# Patient Record
Sex: Male | Born: 1985 | Race: Black or African American | Hispanic: No | Marital: Single | Smoking: Never smoker
Health system: Southern US, Community
[De-identification: ages and names within clinical notes are randomized; demographics above are authoritative.]

## PROBLEM LIST (undated history)

## (undated) DIAGNOSIS — M549 Dorsalgia, unspecified: Secondary | ICD-10-CM

## (undated) DIAGNOSIS — M5430 Sciatica, unspecified side: Secondary | ICD-10-CM

## (undated) DIAGNOSIS — G8929 Other chronic pain: Secondary | ICD-10-CM

## (undated) DIAGNOSIS — W3400XA Accidental discharge from unspecified firearms or gun, initial encounter: Secondary | ICD-10-CM

---

## 2006-09-27 ENCOUNTER — Emergency Department (HOSPITAL_COMMUNITY): Admission: EM | Admit: 2006-09-27 | Discharge: 2006-09-27 | Payer: Self-pay | Admitting: Emergency Medicine

## 2007-02-07 ENCOUNTER — Emergency Department (HOSPITAL_COMMUNITY): Admission: EM | Admit: 2007-02-07 | Discharge: 2007-02-07 | Payer: Self-pay | Admitting: Emergency Medicine

## 2009-08-20 ENCOUNTER — Emergency Department (HOSPITAL_COMMUNITY): Admission: EM | Admit: 2009-08-20 | Discharge: 2009-08-20 | Payer: Self-pay | Admitting: Emergency Medicine

## 2010-06-10 ENCOUNTER — Emergency Department (HOSPITAL_COMMUNITY)
Admission: EM | Admit: 2010-06-10 | Discharge: 2010-06-10 | Disposition: A | Discharge status: Home / Self Care | Payer: Self-pay | Attending: Emergency Medicine | Admitting: Emergency Medicine

## 2010-06-10 ENCOUNTER — Emergency Department (HOSPITAL_COMMUNITY): Payer: Self-pay

## 2010-06-10 DIAGNOSIS — Y9361 Activity, american tackle football: Secondary | ICD-10-CM | POA: Insufficient documentation

## 2010-06-10 DIAGNOSIS — M7989 Other specified soft tissue disorders: Secondary | ICD-10-CM | POA: Insufficient documentation

## 2010-06-10 DIAGNOSIS — W1801XA Striking against sports equipment with subsequent fall, initial encounter: Secondary | ICD-10-CM | POA: Insufficient documentation

## 2010-06-10 DIAGNOSIS — M25569 Pain in unspecified knee: Secondary | ICD-10-CM | POA: Insufficient documentation

## 2010-06-16 LAB — DIFFERENTIAL
Eosinophils Absolute: 0.1 10*3/uL (ref 0.0–0.7)
Eosinophils Relative: 3 % (ref 0–5)
Lymphs Abs: 1.9 10*3/uL (ref 0.7–4.0)
Monocytes Absolute: 0.4 10*3/uL (ref 0.1–1.0)
Monocytes Relative: 8 % (ref 3–12)
Neutro Abs: 1.9 10*3/uL (ref 1.7–7.7)
Neutrophils Relative %: 45 % (ref 43–77)

## 2010-06-16 LAB — ETHANOL: Alcohol, Ethyl (B): <5 mg/dL (ref 0–10)

## 2010-06-16 LAB — BASIC METABOLIC PANEL
BUN: 17 mg/dL (ref 6–23)
CO2: 27 mEq/L (ref 19–32)
Chloride: 103 mEq/L (ref 96–112)
GFR calc Af Amer: >60 mL/min (ref >60)
GFR calc non Af Amer: >60 mL/min (ref >60)
Glucose, Bld: 87 mg/dL (ref 70–99)
Potassium: 4.1 mEq/L (ref 3.5–5.1)

## 2010-06-16 LAB — URINE MICROSCOPIC-ADD ON

## 2010-06-16 LAB — URINALYSIS, ROUTINE W REFLEX MICROSCOPIC
Glucose, UA: NEGATIVE mg/dL
Hgb urine dipstick: NEGATIVE
Nitrite: NEGATIVE
pH: 6 (ref 5.0–8.0)

## 2010-06-16 LAB — TRICYCLICS SCREEN, URINE: TCA Scrn: NOT DETECTED

## 2010-06-16 LAB — CBC
MCHC: 34.2 g/dL (ref 30.0–36.0)
Platelets: 260 10*3/uL (ref 150–400)
RDW: 12.7 % (ref 11.5–15.5)

## 2010-06-16 LAB — RAPID URINE DRUG SCREEN, HOSP PERFORMED: Benzodiazepines: NOT DETECTED

## 2011-09-05 ENCOUNTER — Emergency Department (INDEPENDENT_AMBULATORY_CARE_PROVIDER_SITE_OTHER)
Admission: EM | Admit: 2011-09-05 | Discharge: 2011-09-05 | Disposition: A | Discharge status: Home / Self Care | Payer: Self-pay | Source: Home / Self Care

## 2011-09-05 ENCOUNTER — Encounter (HOSPITAL_COMMUNITY): Payer: Self-pay | Admitting: *Deleted

## 2011-09-05 DIAGNOSIS — M549 Dorsalgia, unspecified: Secondary | ICD-10-CM

## 2011-09-05 MED ORDER — PREDNISONE 50 MG PO TABS: 50.0000 mg | ORAL_TABLET | Freq: Every day | ORAL | Status: DC

## 2011-09-05 MED ORDER — IBUPROFEN 800 MG PO TABS: 800.0000 mg | ORAL_TABLET | Freq: Three times a day (TID) | ORAL | Status: AC

## 2011-09-05 MED ORDER — KETOROLAC TROMETHAMINE 60 MG/2ML IM SOLN
60.0000 mg | Freq: Once | INTRAMUSCULAR | Status: AC
  Administered 2011-09-05: 60 mg via INTRAMUSCULAR

## 2011-09-05 MED ORDER — CYCLOBENZAPRINE HCL 10 MG PO TABS: 10.0000 mg | ORAL_TABLET | Freq: Two times a day (BID) | ORAL | Status: AC | PRN

## 2011-09-05 MED ORDER — KETOROLAC TROMETHAMINE 60 MG/2ML IM SOLN
INTRAMUSCULAR | Status: AC
  Filled 2011-09-05: qty 2

## 2011-09-05 NOTE — ED Notes (Signed)
Pt with onset of low back pain x one month - worse with movement - per pt onset after lifting a motorized wheelchair

## 2011-09-05 NOTE — Discharge Instructions (Signed)
Back Exercises   Back exercises help treat and prevent back injuries. The goal of back exercises is to increase the strength of your abdominal and back muscles and the flexibility of your back. These exercises should be started when you no longer have back pain. Back exercises include:   Pelvic Tilt. Lie on your back with your knees bent. Tilt your pelvis until the lower part of your back is against the floor. Hold this position 5 to 10 sec and repeat 5 to 10 times.   Knee to Chest. Pull first 1 knee up against your chest and hold for 20 to 30 seconds, repeat this with the other knee, and then both knees. This may be done with the other leg straight or bent, whichever feels better.   Sit-Ups or Curl-Ups. Bend your knees 90 degrees. Start with tilting your pelvis, and do a partial, slow sit-up, lifting your trunk only 30 to 45 degrees off the floor. Take at least 2 to 3 seconds for each sit-up. Do not do sit-ups with your knees out straight. If partial sit-ups are difficult, simply do the above but with only tightening your abdominal muscles and holding it as directed.   Hip-Lift. Lie on your back with your knees flexed 90 degrees. Push down with your feet and shoulders as you raise your hips a couple inches off the floor; hold for 10 seconds, repeat 5 to 10 times.   Back arches. Lie on your stomach, propping yourself up on bent elbows. Slowly press on your hands, causing an arch in your low back. Repeat 3 to 5 times. Any initial stiffness and discomfort should lessen with repetition over time.   Shoulder-Lifts. Lie face down with arms beside your body. Keep hips and torso pressed to floor as you slowly lift your head and shoulders off the floor.   Do not overdo your exercises, especially in the beginning. Exercises may cause you some mild back discomfort which lasts for a few minutes; however, if the pain is more severe, or lasts for more than 15 minutes, do not continue exercises until you see your caregiver.  Improvement with exercise therapy for back problems is slow.   See your caregivers for assistance with developing a proper back exercise program.   Document Released: 04/23/2004 Document Revised: 03/05/2011 Document Reviewed: 03/16/2005   Exit[employer]® Patient Information ©2012 [employer].     Back Pain, Adult   Low back pain is very common. About 1 in 5 people have back pain. The cause of low back pain is rarely dangerous. The pain often gets better over time. About half of people with a sudden onset of back pain feel better in just 2 weeks. About 8 in 10 people feel better by 6 weeks.   CAUSES   Some common causes of back pain include:   Strain of the muscles or ligaments supporting the spine.   Wear and tear (degeneration) of the spinal discs.   Arthritis.   Direct injury to the back.   DIAGNOSIS   Most of the time, the direct cause of low back pain is not known. However, back pain can be treated effectively even when the exact cause of the pain is unknown. Answering your caregiver's questions about your overall health and symptoms is one of the most accurate ways to make sure the cause of your pain is not dangerous. If your caregiver needs more information, he or she may order lab work or imaging tests (X-rays or MRIs). However, even   if imaging tests show changes in your back, this usually does not require surgery.   HOME CARE INSTRUCTIONS   For many people, back pain returns. Since low back pain is rarely dangerous, it is often a condition that people can learn to manage on their own.   Remain active. It is stressful on the back to sit or stand in one place. Do not sit, drive, or stand in one place for more than 30 minutes at a time. Take short walks on level surfaces as soon as pain allows. Try to increase the length of time you walk each day.   Do not stay in bed. Resting more than 1 or 2 days can delay your recovery.   Do not avoid exercise or work. Your body is made to move. It is not dangerous to be active,  even though your back may hurt. Your back will likely heal faster if you return to being active before your pain is gone.   Pay attention to your body when you bend and lift. Many people have less discomfort when lifting if they bend their knees, keep the load close to their bodies, and avoid twisting. Often, the most comfortable positions are those that put less stress on your recovering back.   Find a comfortable position to sleep. Use a firm mattress and lie on your side with your knees slightly bent. If you lie on your back, put a pillow under your knees.   Only take over-the-counter or prescription medicines as directed by your caregiver. Over-the-counter medicines to reduce pain and inflammation are often the most helpful. Your caregiver may prescribe muscle relaxant drugs. These medicines help dull your pain so you can more quickly return to your normal activities and healthy exercise.   Put ice on the injured area.   Put ice in a plastic bag.   Place a towel between your skin and the bag.   Leave the ice on for 15 to 20 minutes, 3 to 4 times a day for the first 2 to 3 days. After that, ice and heat may be alternated to reduce pain and spasms.   Ask your caregiver about trying back exercises and gentle massage. This may be of some benefit.   Avoid feeling anxious or stressed. Stress increases muscle tension and can worsen back pain. It is important to recognize when you are anxious or stressed and learn ways to manage it. Exercise is a great option.   SEEK MEDICAL CARE IF:   You have pain that is not relieved with rest or medicine.   You have pain that does not improve in 1 week.   You have new symptoms.   You are generally not feeling well.   SEEK IMMEDIATE MEDICAL CARE IF:   You have pain that radiates from your back into your legs.   You develop new bowel or bladder control problems.   You have unusual weakness or numbness in your arms or legs.   You develop nausea or vomiting.   You develop abdominal  pain.   You feel faint.   Document Released: 03/16/2005 Document Revised: 03/05/2011 Document Reviewed: 08/04/2010   ExitCare® Patient Information ©2012 [employer].

## 2011-09-05 NOTE — ED Provider Notes (Signed)
History     CSN: [id number]  Arrival date & time 09/05/11  1255   None     Chief Complaint  Patient presents with  . Back Pain    (Consider location/radiation/quality/duration/timing/severity/associated sxs/prior treatment) Patient is a 26 y.o. male presenting with back pain. The history is provided by the patient.  Back Pain   complains of right low back pain described as intermittent sharp in nature that began 2 months ago. The pain is aggravated with standing and walking, with no radiation. No known injury noted.  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  No istory of back problems. Has not taken medication for pain.  Denies urinary symptoms.  Pain is 9/10. No red flags such as fevers, age >66, h/o trauma with bony tenderness, neurological deficits, h/o CA, unexplained weight loss, pain worse at night, pain at rest,  h/o prolonged steroid use or h/o osteopenia.    History reviewed. No pertinent past medical history.  History reviewed. No pertinent past surgical history.  History reviewed. No pertinent family history.  History  Substance Use Topics  . Smoking status: Never Smoker   . Smokeless tobacco: Not on file  . Alcohol Use: No      Review of Systems  Musculoskeletal: Positive for back pain.  All other systems reviewed and are negative.    Allergies  Review of patient's allergies indicates no known allergies.  Home Medications   Current Outpatient Rx  Name Route Sig Dispense Refill  . CYCLOBENZAPRINE HCL 10 MG PO TABS Oral Take 1 tablet (10 mg total) by mouth 2 (two) times daily as needed for muscle spasms. 20 tablet 0  . IBUPROFEN 800 MG PO TABS Oral Take 1 tablet (800 mg total) by mouth 3 (three) times daily. 21 tablet 0  . PREDNISONE 50 MG PO TABS Oral Take 1 tablet (50 mg total) by mouth daily. For five days. 15 tablet 0    BP 120/71  Pulse 54  Temp(Src) 98 F (36.7 C) (Oral)  Resp 18   SpO2 100%  Physical Exam  Nursing note and vitals reviewed. Constitutional: He is oriented to person, place, and time. Vital signs are normal. He appears well-developed and well-nourished. He is active and cooperative.  HENT:  Head: Normocephalic.  Eyes: Conjunctivae are normal. Pupils are equal, round, and reactive to light. No scleral icterus.  Neck: Trachea normal. Neck supple.  Cardiovascular: Normal rate, regular rhythm and normal heart sounds.   Pulmonary/Chest: Effort normal and breath sounds normal.  Musculoskeletal:       Cervical back: Normal.       Thoracic back: Normal.       Lumbar back: He exhibits tenderness and spasm. He exhibits normal range of motion, no bony tenderness and no swelling.       Decreased flexion approx. 45 degrees, normal (painful) lateral bending, normal (painful) right and left rotation.  Lower paraspinal tenderness upon palpation.  Neurological: He is alert and oriented to person, place, and time. He has normal strength and normal reflexes. No cranial nerve deficit or sensory deficit. He displays a negative Romberg sign. Gait normal. GCS eye subscore is 4. GCS verbal subscore is 5. GCS motor subscore is 6.  Skin: Skin is warm, dry and intact.  Psychiatric: He has a normal mood and affect. His speech is normal and behavior is normal. Judgment and thought content normal. Cognition and memory are normal.  ED Course  Procedures (including critical care time)  Labs Reviewed - No data to display No results found.   1. Back pain       MDM  Typical low back pain.  Rest (no lifting for one week), intermittent application of cold packs (later, may switch to heat, but do not sleep on heating pad), analgesics and muscle relaxants as recommended. Discussed longer term treatment plan of prn NSAID's and discussed a home back care exercise program with flexion exercise routine. Proper lifting with avoidance of heavy lifting discussed. Consider physical therapy  and X-ray studies if not improving. Call or return to clinic prn if these symptoms worsen or fail to improve as anticipated. Imaging not indicated at this time.         Johnsie Kindred, NP 09/05/11 2104

## 2011-09-06 NOTE — ED Provider Notes (Signed)
Medical screening examination/treatment/procedure(s) were performed by non-physician practitioner and as supervising physician I was immediately available for consultation/collaboration.  Wing Gfeller   Tierre Gerard, MD 09/06/11 1038 

## 2012-04-15 ENCOUNTER — Emergency Department (HOSPITAL_COMMUNITY)
Admission: EM | Admit: 2012-04-15 | Discharge: 2012-04-15 | Disposition: A | Discharge status: Home / Self Care | Payer: Self-pay | Attending: Emergency Medicine | Admitting: Emergency Medicine

## 2012-04-15 ENCOUNTER — Encounter (HOSPITAL_COMMUNITY): Payer: Self-pay | Admitting: *Deleted

## 2012-04-15 DIAGNOSIS — M5442 Lumbago with sciatica, left side: Secondary | ICD-10-CM

## 2012-04-15 DIAGNOSIS — M545 Low back pain, unspecified: Secondary | ICD-10-CM | POA: Insufficient documentation

## 2012-04-15 DIAGNOSIS — M79609 Pain in unspecified limb: Secondary | ICD-10-CM | POA: Insufficient documentation

## 2012-04-15 DIAGNOSIS — M5430 Sciatica, unspecified side: Secondary | ICD-10-CM | POA: Insufficient documentation

## 2012-04-15 MED ORDER — HYDROCODONE-ACETAMINOPHEN 5-325 MG PO TABS: 1.0000 | ORAL_TABLET | ORAL | Status: DC | PRN

## 2012-04-15 MED ORDER — METHOCARBAMOL 500 MG PO TABS: 500.0000 mg | ORAL_TABLET | Freq: Two times a day (BID) | ORAL | Status: DC

## 2012-04-15 NOTE — ED Provider Notes (Signed)
History     CSN: [id number]  Arrival date & time 04/15/12  0800   First MD Initiated Contact with Patient 04/15/12 0803      No chief complaint on file.   (Consider location/radiation/quality/duration/timing/severity/associated sxs/prior treatment) HPI  27 year old male presents complaining of left leg pain. Patient reports for the past 2 months he has developed gradual onset of sharp shooting pain which started from his low back and radiates down to the back of his left leg. Pain is moderate in severity, radiating, occurring daily, worsening when he stands still or worsening with certain position. He tried stretching without adequate relief. Due to the duration of his pain he decided to come to the ER today for further evaluation. He denies any worsening of his pain. Patient denies fever, chills, rash, urinary or bowel incontinence, or saddle paresthesia.  No history of IV drug use, no history of abnormal weight loss, no recent trauma. Denies dysuria, hematuria. No significant medical history of back pain. 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  No past medical history on file.  No past surgical history on file.  No family history on file.  History  Substance Use Topics  . Smoking status: Never Smoker   . Smokeless tobacco: Not on file  . Alcohol Use: No      Review of Systems  Constitutional:       A complete 10 system review of systems was obtained and all systems are negative except as noted in the HPI and PMH.    Allergies  Review of patient's allergies indicates no known allergies.  Home Medications  No current outpatient prescriptions on file.  There were no vitals taken for this visit.  Physical Exam  Nursing note and vitals reviewed. Constitutional: He is oriented to person, place, and time. He appears well-developed and well-nourished. No distress.  HENT:  Head: Atraumatic.  Eyes: Conjunctivae normal are normal.  Neck: Neck  supple.  Abdominal: Soft. There is no tenderness. There is no guarding.  Musculoskeletal: He exhibits tenderness (no midline spine tenderness, stepoff or crepitus.  Positive straight leg raise on L leg.  Patella DTR 2+, intact distal pulses, no foot drop.  BLE without palpable cords, erythema, edema, calf pain.). He exhibits no edema.  Neurological: He is alert and oriented to person, place, and time.  Skin: Skin is warm. No rash noted.  Psychiatric: He has a normal mood and affect.    ED Course  Procedures (including critical care time)  Labs Reviewed - No data to display No results found.   No diagnosis found.  1. Sciatica, L  MDM  Pt presents with radicular pain to lower extremity suggestive of L sided sciatica.  No red flags.  Plan to prescribe pain medication, muscle relaxant, exercise instruction and referral to ortho for further care.  Pt made aware not to take narcotic or muscle relaxant while working, operating heavy machinery or driving.  Pt voice understanding and agrees with plan.    Pt able to ambulate without difficulty, afebrile, VSS.   BP 124/79  Pulse 51  Temp 97.2 F (36.2 C) (Oral)  Resp 18  SpO2 100%        Fayrene Helper, PA-C 04/15/12 [phone]

## 2012-04-15 NOTE — ED Notes (Signed)
Pt reports doing heavy lifting at work, has pain to left buttock and down back of left leg, some lower back pain. Ambulatory on arrival.

## 2012-04-22 NOTE — ED Provider Notes (Signed)
Medical screening examination/treatment/procedure(s) were performed by non-physician practitioner and as supervising physician I was immediately available for consultation/collaboration.  Jasmine Awe, MD 04/22/12 2300

## 2012-09-19 ENCOUNTER — Emergency Department (HOSPITAL_COMMUNITY)
Admission: EM | Admit: 2012-09-19 | Discharge: 2012-09-20 | Disposition: A | Discharge status: Home / Self Care | Payer: Self-pay | Attending: Emergency Medicine | Admitting: Emergency Medicine

## 2012-09-19 ENCOUNTER — Encounter (HOSPITAL_COMMUNITY): Payer: Self-pay | Admitting: Emergency Medicine

## 2012-09-19 DIAGNOSIS — M5430 Sciatica, unspecified side: Secondary | ICD-10-CM | POA: Insufficient documentation

## 2012-09-19 DIAGNOSIS — M545 Low back pain: Secondary | ICD-10-CM

## 2012-09-19 DIAGNOSIS — G8929 Other chronic pain: Secondary | ICD-10-CM | POA: Insufficient documentation

## 2012-09-19 HISTORY — DX: Dorsalgia, unspecified: M54.9

## 2012-09-19 HISTORY — DX: Other chronic pain: G89.29

## 2012-09-19 HISTORY — DX: Sciatica, unspecified side: M54.30

## 2012-09-19 NOTE — ED Provider Notes (Signed)
History  This chart was scribed for non-physician practitioner, Dierdre Forth PA-C, working with Brandt Loosen, MD by Ardeen Jourdain, ED Scribe. This patient was seen in room [facility] and the patient's care was started at 2338.  CSN: [id number] Arrival date & time 09/19/12  2127   First MD Initiated Contact with Patient 09/19/12 2338     Chief Complaint  Patient presents with  . Back Pain    Patient is a 27 y.o. male presenting with back pain. The history is provided by the patient. No language interpreter was used.  Back Pain Location:  Lumbar spine Quality:  Aching Radiates to:  L posterior upper leg Pain severity:  Mild Onset quality:  Gradual Timing:  Constant Progression:  Worsening Chronicity:  New Relieved by:  Nothing Worsened by:  Ambulation, touching and bending Ineffective treatments:  None tried Associated symptoms: no abdominal pain, no abdominal swelling, no bladder incontinence, no bowel incontinence, no chest pain, no dysuria, no fever, no headaches, no paresthesias, no tingling, no weakness and no weight loss     HPI Comments: Juan Gardner is a 27 y.o. male with a h/o sciatica and chronic back pain who presents to the Emergency Department complaining of gradual onset, gradually worsening, intermittent left lower back pain that began 8 months ago. He states the symptoms began to steadily worsen and become constant 4 months ago. He states the pain will radiate down his left leg. He states he was evaluated by Urgent Care a few months ago for the symptoms who advised him to come to the ED if the symptoms worsen. He denies any bowel incontinence, bladder incontinence, weakness, numbness and tingling as associated symptoms. He denies taking anything for the pain.  Past Medical History  Diagnosis Date  . Sciatica   . Chronic back pain    History reviewed. No pertinent past surgical history. No family history on file. History  Substance Use Topics  . Smoking  status: Never Smoker   . Smokeless tobacco: Not on file  . Alcohol Use: No    Review of Systems  Constitutional: Negative for fever and weight loss.  Cardiovascular: Negative for chest pain.  Gastrointestinal: Negative for abdominal pain and bowel incontinence.  Genitourinary: Negative for bladder incontinence and dysuria.  Musculoskeletal: Positive for back pain.  Neurological: Negative for tingling, weakness, headaches and paresthesias.  All other systems reviewed and are negative.    Allergies  Review of patient's allergies indicates no known allergies.  Home Medications   Current Outpatient Rx  Name  Route  Sig  Dispense  Refill  . HYDROcodone-acetaminophen (NORCO/VICODIN) 5-325 MG per tablet   Oral   Take 1 tablet by mouth every 6 (six) hours as needed for pain (Take 1 - 2 tablets every 4 - 6 hours.).   15 tablet   0   . methocarbamol (ROBAXIN) 750 MG tablet   Oral   Take 1 tablet (750 mg total) by mouth 4 (four) times daily as needed (Take 1 tablet every 6 hours as needed for muscle spasms.).   20 tablet   0   . naproxen (NAPROSYN) 500 MG tablet   Oral   Take 1 tablet (500 mg total) by mouth 2 (two) times daily as needed.   30 tablet   0   . predniSONE (DELTASONE) 50 MG tablet      Take 1 a day for 7 days.   7 tablet   0     Triage Vitals: BP 130/76  Pulse  52  Temp(Src) 98.5 F (36.9 C) (Oral)  Resp 16  SpO2 100%  Physical Exam  Nursing note and vitals reviewed. Constitutional: He appears well-developed and well-nourished. No distress.  HENT:  Head: Normocephalic and atraumatic.  Mouth/Throat: Oropharynx is clear and moist. No oropharyngeal exudate.  Eyes: Conjunctivae are normal.  Neck: Normal range of motion. Neck supple.  Full ROM without pain  Cardiovascular: Normal rate, regular rhythm, normal heart sounds and intact distal pulses.  Exam reveals no gallop and no friction rub.   No murmur heard. Pulmonary/Chest: Effort normal and breath  sounds normal. No respiratory distress. He has no wheezes. He has no rales. He exhibits no tenderness.  Abdominal: Soft. He exhibits no distension. There is no tenderness.  Musculoskeletal:  Full range of motion of the T-spine. Mildly deceased ROM of L-spine.  No tenderness to palpation of the spinous processes of the T-spine or L-spine. Mild tenderness to palpation of the left paraspinous muscles of the L-spine Reproducible radicular pain with palpation of left buttock   Lymphadenopathy:    He has no cervical adenopathy.  Neurological: He is alert. He has normal reflexes.  Speech is clear and goal oriented, follows commands Normal strength in upper and lower extremities bilaterally including dorsiflexion and plantar flexion, strong and equal grip strength Sensation normal to light and sharp touch Moves extremities without ataxia, coordination intact Normal gait Normal balance   Skin: Skin is warm and dry. No rash noted. He is not diaphoretic. No erythema.    ED Course  Procedures (including critical care time)  DIAGNOSTIC STUDIES: Oxygen Saturation is 100% on room air, normal by my interpretation.    COORDINATION OF CARE:  11:58 PM-Discussed treatment plan which includes pain medication and follow up with an orthopedist with pt at bedside and pt agreed to plan.   Labs Reviewed - No data to display No results found. 1. Low back pain with sciatica, left   2. Low back pain     MDM  Juan Gardner presents with hx and PE consistent with low back pain with radiculopathy and sciatica.   No neurological deficits and normal neuro exam.  Patient can walk but states is painful.  No loss of bowel or bladder control.  No concern for cauda equina.  No fever, night sweats, weight loss, h/o cancer, IVDU.  RICE protocol and pain medicine indicated and discussed with patient. I have also discussed reasons to return immediately to the ER.  Patient expresses understanding and agrees with plan.  I  personally performed the services described in this documentation, which was scribed in my presence. The recorded information has been reviewed and is accurate.    Dahlia Client Ruberta Holck, PA-C 09/20/12 0007

## 2012-09-19 NOTE — ED Notes (Signed)
PT. REPORTS CHRONIC LEFT LOWER BACK PAIN RADIATING TO LEFT LEG WORSE PAST SEVERAL DAYS , PT. STATED HISTORY OF SCIATICA.

## 2012-09-20 MED ORDER — NAPROXEN 500 MG PO TABS: 500.0000 mg | ORAL_TABLET | Freq: Two times a day (BID) | ORAL | Status: DC | PRN

## 2012-09-20 MED ORDER — HYDROCODONE-ACETAMINOPHEN 5-325 MG PO TABS: 1.0000 | ORAL_TABLET | Freq: Four times a day (QID) | ORAL | Status: DC | PRN

## 2012-09-20 MED ORDER — METHOCARBAMOL 750 MG PO TABS: 750.0000 mg | ORAL_TABLET | Freq: Four times a day (QID) | ORAL | Status: DC | PRN

## 2012-09-20 MED ORDER — PREDNISONE 50 MG PO TABS: ORAL_TABLET | ORAL | Status: DC

## 2012-09-20 MED ORDER — METHOCARBAMOL 500 MG PO TABS
500.0000 mg | ORAL_TABLET | Freq: Once | ORAL | Status: AC
  Administered 2012-09-20: 500 mg via ORAL
  Filled 2012-09-20: qty 1

## 2012-09-20 MED ORDER — OXYCODONE-ACETAMINOPHEN 5-325 MG PO TABS
2.0000 | ORAL_TABLET | Freq: Once | ORAL | Status: AC
  Administered 2012-09-20: 2 via ORAL
  Filled 2012-09-20: qty 2

## 2012-09-20 NOTE — ED Provider Notes (Signed)
Medical screening examination/treatment/procedure(s) were performed by non-physician practitioner and as supervising physician I was immediately available for consultation/collaboration.   Darilyn Storbeck, MD 09/20/12 0608 

## 2013-08-07 ENCOUNTER — Emergency Department (HOSPITAL_COMMUNITY)
Admission: EM | Admit: 2013-08-07 | Discharge: 2013-08-07 | Disposition: A | Discharge status: Home / Self Care | Payer: Self-pay | Attending: Emergency Medicine | Admitting: Emergency Medicine

## 2013-08-07 ENCOUNTER — Encounter (HOSPITAL_COMMUNITY): Payer: Self-pay | Admitting: Emergency Medicine

## 2013-08-07 DIAGNOSIS — M545 Low back pain, unspecified: Secondary | ICD-10-CM | POA: Insufficient documentation

## 2013-08-07 DIAGNOSIS — M5380 Other specified dorsopathies, site unspecified: Secondary | ICD-10-CM | POA: Insufficient documentation

## 2013-08-07 DIAGNOSIS — M533 Sacrococcygeal disorders, not elsewhere classified: Secondary | ICD-10-CM | POA: Insufficient documentation

## 2013-08-07 DIAGNOSIS — G8929 Other chronic pain: Secondary | ICD-10-CM | POA: Insufficient documentation

## 2013-08-07 MED ORDER — IBUPROFEN 400 MG PO TABS
800.0000 mg | ORAL_TABLET | Freq: Once | ORAL | Status: AC
  Administered 2013-08-07: 800 mg via ORAL
  Filled 2013-08-07: qty 2

## 2013-08-07 MED ORDER — METHOCARBAMOL 500 MG PO TABS
500.0000 mg | ORAL_TABLET | Freq: Once | ORAL | Status: AC
  Administered 2013-08-07: 500 mg via ORAL
  Filled 2013-08-07: qty 1

## 2013-08-07 MED ORDER — METHOCARBAMOL 500 MG PO TABS: 500.0000 mg | ORAL_TABLET | Freq: Two times a day (BID) | ORAL | Status: AC

## 2013-08-07 MED ORDER — LIDOCAINE 5 % EX PTCH: 1.0000 | MEDICATED_PATCH | CUTANEOUS | Status: AC

## 2013-08-07 MED ORDER — IBUPROFEN 800 MG PO TABS: 800.0000 mg | ORAL_TABLET | Freq: Three times a day (TID) | ORAL | Status: AC

## 2013-08-07 NOTE — ED Notes (Signed)
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   Patient states that "I think I pulled a muscle".  Patient states he didn't take anything at home for the pain.   He just wanted "to know if something is wrong".

## 2013-08-07 NOTE — ED Provider Notes (Signed)
CSN: [id number]     Arrival date & time 08/07/13  0807 History   First MD Initiated Contact with Patient 08/07/13 0813     Chief Complaint  Patient presents with  . Back Pain     (Consider location/radiation/quality/duration/timing/severity/associated sxs/prior Treatment) HPI Comments: Patient is a 28 year old male past medical history significant for chronic back pain with sciatica presenting to the emergency department for diffuse low back tightness without radiation. He states he was lifting a can on Saturday while at work he felt he pulled a muscle. States his pain has been getting gradually worse. Alleviating factors: icy hot and massages. Aggravating factors: sitting still for prolonged period of time. No medications PTA. Denies any fevers, chills, nausea, vomiting, bladder or bowel incontinence, night sweats, history of IV drug use or cancer.   Patient is a 28 y.o. male presenting with back pain.  Back Pain Associated symptoms: no fever     Past Medical History  Diagnosis Date  . Sciatica   . Chronic back pain    History reviewed. No pertinent past surgical history. No family history on file. History  Substance Use Topics  . Smoking status: Never Smoker   . Smokeless tobacco: Not on file  . Alcohol Use: No    Review of Systems  Constitutional: Negative for fever and chills.  Musculoskeletal: Positive for back pain.  Neurological: Negative for syncope.  All other systems reviewed and are negative.     Allergies  Review of patient's allergies indicates no known allergies.  Home Medications   Prior to Admission medications   Not on File   BP 114/63  Pulse 65  Temp(Src) 98 F (36.7 C) (Oral)  Resp 18  Ht 6' (1.829 m)  Wt 188 lb (85.276 kg)  BMI 25.49 kg/m2  SpO2 100% Physical Exam  Nursing note and vitals reviewed. Constitutional: He is oriented to person, place, and time. He appears well-developed and well-nourished. No distress.  HENT:  Head:  Normocephalic and atraumatic.  Right Ear: External ear normal.  Left Ear: External ear normal.  Nose: Nose normal.  Mouth/Throat: Oropharynx is clear and moist. No oropharyngeal exudate.  Eyes: Conjunctivae and EOM are normal. Pupils are equal, round, and reactive to light.  Neck: Normal range of motion. Neck supple.  Cardiovascular: Normal rate, regular rhythm, normal heart sounds and intact distal pulses.   Pulmonary/Chest: Effort normal and breath sounds normal. No respiratory distress.  Abdominal: Soft. There is no tenderness.  Musculoskeletal:       Cervical back: Normal. He exhibits normal range of motion, no tenderness, no bony tenderness and no deformity.       Thoracic back: Normal.       Lumbar back: He exhibits tenderness and spasm. He exhibits normal range of motion, no bony tenderness, no swelling, no deformity and no pain.  Neurological: He is alert and oriented to person, place, and time. He has normal strength. No cranial nerve deficit. Gait normal. GCS eye subscore is 4. GCS verbal subscore is 5. GCS motor subscore is 6.  Sensation grossly intact.  No pronator drift.  Bilateral heel-knee-shin intact.  Skin: Skin is warm and dry. He is not diaphoretic.    ED Course  Procedures (including critical care time) Medications  methocarbamol (ROBAXIN) tablet 500 mg (500 mg Oral Given 08/07/13 0849)  ibuprofen (ADVIL,MOTRIN) tablet 800 mg (800 mg Oral Given 08/07/13 0849)    Labs Review Labs Reviewed - No data to display  Imaging Review No results found.  EKG Interpretation None      MDM   Final diagnoses:  Back pain, lumbosacral    Filed Vitals:   08/07/13 0822  BP: 114/63  Pulse: 65  Temp: 98 F (36.7 C)  Resp: 18   Afebrile, NAD, non-toxic appearing, AAOx4.  Patient with back pain.  No neurological deficits and normal neuro exam.  Patient can walk but states is painful.  No loss of bowel or bladder control.  No concern for cauda equina.  No fever, night  sweats, weight loss, h/o cancer, IVDU.  RICE protocol and pain medicine indicated and discussed with patient. Return precautions discussed. Patient agreeable to plan. Patient stable at time of discharge.     Jeannetta EllisJennifer L Lillyauna Jenkinson, PA-C 08/07/13 [phone]

## 2013-08-07 NOTE — Discharge Instructions (Signed)
Please follow up with your primary care physician in 1-2 days. If you do not have one please [facility] listed above. Please take pain medication and/or muscle relaxants as prescribed and as needed for pain. Please do not drive on narcotic pain medication or on muscle relaxants. Please alternate between Motrin and Tylenol every three hours for fevers and pain. Please use lidoderm patches as prescribed. Please read all discharge instructions and return precautions.   Back Pain, Adult Low back pain is very common. About 1 in 5 people have back pain.The cause of low back pain is rarely dangerous. The pain often gets better over time.About half of people with a sudden onset of back pain feel better in just 2 weeks. About 8 in 10 people feel better by 6 weeks.  CAUSES Some common causes of back pain include:  Strain of the muscles or ligaments supporting the spine.  Wear and tear (degeneration) of the spinal discs.  Arthritis.  Direct injury to the back. DIAGNOSIS Most of the time, the direct cause of low back pain is not known.However, back pain can be treated effectively even when the exact cause of the pain is unknown.Answering your caregiver's questions about your overall health and symptoms is one of the most accurate ways to make sure the cause of your pain is not dangerous. If your caregiver needs more information, he or she may order lab work or imaging tests (X-rays or MRIs).However, even if imaging tests show changes in your back, this usually does not require surgery. HOME CARE INSTRUCTIONS For many people, back pain returns.Since low back pain is rarely dangerous, it is often a condition that people can learn to [facility] their own.   Remain active. It is stressful on the back to sit or stand in one place. Do not sit, drive, or stand in one place for more than 30 minutes at a time. Take short walks on level surfaces as soon as pain allows.Try to  increase the length of time you walk each day.  Do not stay in bed.Resting more than 1 or 2 days can delay your recovery.  Do not avoid exercise or work.Your body is made to move.It is not dangerous to be active, even though your back may hurt.Your back will likely heal faster if you return to being active before your pain is gone.  Pay attention to your body when you bend and lift. Many people have less discomfortwhen lifting if they bend their knees, keep the load close to their bodies,and avoid twisting. Often, the most comfortable positions are those that put less stress on your recovering back.  Find a comfortable position to sleep. Use a firm mattress and lie on your side with your knees slightly bent. If you lie on your back, put a pillow under your knees.  Only take over-the-counter or prescription medicines as directed by your caregiver. Over-the-counter medicines to reduce pain and inflammation are often the most helpful.Your caregiver may prescribe muscle relaxant drugs.These medicines help dull your pain so you can more quickly return to your normal activities and healthy exercise.  Put ice on the injured area.  Put ice in a plastic bag.  Place a towel between your skin and the bag.  Leave the ice on for 15-20 minutes, 03-04 times a day for the first 2 to 3 days. After that, ice and heat may be alternated to reduce pain and spasms.  Ask your caregiver about trying back exercises  and gentle massage. This may be of some benefit.  Avoid feeling anxious or stressed.Stress increases muscle tension and can worsen back pain.It is important to recognize when you are anxious or stressed and learn ways to manage it.Exercise is a great option. SEEK MEDICAL CARE IF:  You have pain that is not relieved with rest or medicine.  You have pain that does not improve in 1 week.  You have new symptoms.  You are generally not feeling well. SEEK IMMEDIATE MEDICAL CARE IF:   You  have pain that radiates from your back into your legs.  You develop new bowel or bladder control problems.  You have unusual weakness or numbness in your arms or legs.  You develop nausea or vomiting.  You develop abdominal pain.  You feel faint. Document Released: 03/16/2005 Document Revised: 09/15/2011 Document Reviewed: 08/04/2010 [facility] Patient Information 2014 [employer] [place].

## 2013-08-07 NOTE — ED Provider Notes (Signed)
Medical screening examination/treatment/procedure(s) were performed by non-physician practitioner and as supervising physician I was immediately available for consultation/collaboration.   EKG Interpretation None        Luella Gardenhire, MD 08/07/13 1612 

## 2014-03-26 ENCOUNTER — Emergency Department (HOSPITAL_COMMUNITY)
Admission: EM | Admit: 2014-03-26 | Discharge: 2014-03-26 | Disposition: A | Discharge status: Home / Self Care | Payer: Self-pay | Attending: Emergency Medicine | Admitting: Emergency Medicine

## 2014-03-26 ENCOUNTER — Encounter (HOSPITAL_COMMUNITY): Payer: Self-pay | Admitting: Family Medicine

## 2014-03-26 DIAGNOSIS — G8929 Other chronic pain: Secondary | ICD-10-CM | POA: Insufficient documentation

## 2014-03-26 DIAGNOSIS — K0889 Other specified disorders of teeth and supporting structures: Secondary | ICD-10-CM

## 2014-03-26 DIAGNOSIS — K047 Periapical abscess without sinus: Secondary | ICD-10-CM | POA: Insufficient documentation

## 2014-03-26 DIAGNOSIS — K029 Dental caries, unspecified: Secondary | ICD-10-CM | POA: Insufficient documentation

## 2014-03-26 DIAGNOSIS — K088 Other specified disorders of teeth and supporting structures: Secondary | ICD-10-CM | POA: Insufficient documentation

## 2014-03-26 DIAGNOSIS — Z791 Long term (current) use of non-steroidal anti-inflammatories (NSAID): Secondary | ICD-10-CM | POA: Insufficient documentation

## 2014-03-26 MED ORDER — PENICILLIN V POTASSIUM 500 MG PO TABS: 500.0000 mg | ORAL_TABLET | Freq: Four times a day (QID) | ORAL | Status: AC

## 2014-03-26 MED ORDER — PENICILLIN V POTASSIUM 250 MG PO TABS
500.0000 mg | ORAL_TABLET | Freq: Once | ORAL | Status: AC
  Administered 2014-03-26: 500 mg via ORAL
  Filled 2014-03-26: qty 2

## 2014-03-26 MED ORDER — HYDROCODONE-ACETAMINOPHEN 5-325 MG PO TABS
1.0000 | ORAL_TABLET | Freq: Once | ORAL | Status: AC
Start: 2014-03-26 — End: 2014-03-26
  Administered 2014-03-26: 1 via ORAL
  Filled 2014-03-26: qty 1

## 2014-03-26 MED ORDER — HYDROCODONE-ACETAMINOPHEN 5-325 MG PO TABS: 1.0000 | ORAL_TABLET | ORAL | Status: AC | PRN

## 2014-03-26 NOTE — ED Provider Notes (Signed)
CSN: [id number]     Arrival date & time 03/26/14  1530 [id number]   First MD Initiated Contact with Patient 03/26/14 1814     Chief Complaint  Patient presents with  . Dental Pain     (Consider location/radiation/quality/duration/timing/severity/associated sxs/prior Treatment) The history is provided by the patient and medical records.    This is a 28 year old male with past medical history significant for sciatica, presenting to the ED for left lower dental pain was present upon waking this morning. He states he feels swelling in the back of his mouth. Pain worse with chewing on affected side. No difficulty swallowing. No fever or chills. No facial swelling, numbness, or paresthesias. No intervention tried prior to arrival.  Patient is not currently established with a dentist.  Past Medical History  Diagnosis Date  . Sciatica   . Chronic back pain    History reviewed. No pertinent past surgical history. History reviewed. No pertinent family history. History  Substance Use Topics  . Smoking status: Never Smoker   . Smokeless tobacco: Not on file  . Alcohol Use: No    Review of Systems  HENT: Positive for dental problem.   All other systems reviewed and are negative.     Allergies  Review of patient's allergies indicates no known allergies.  Home Medications   Prior to Admission medications   Medication Sig Start Date End Date Taking? Authorizing Provider  ibuprofen (ADVIL,MOTRIN) 800 MG tablet Take 1 tablet (800 mg total) by mouth 3 (three) times daily. 08/07/13  Yes Jennifer L Piepenbrink, PA-C  HYDROcodone-acetaminophen (NORCO/VICODIN) 5-325 MG per tablet Take 1 tablet by mouth every 4 (four) hours as needed. 03/26/14   Garlon HatchetLisa M Sanders, PA-C  lidocaine (LIDODERM) 5 % Place 1 patch onto the skin daily. Remove & Discard patch within 12 hours or as directed by MD Patient not taking: Reported on 03/26/2014 08/07/13   Victorino DikeJennifer L Piepenbrink, PA-C  methocarbamol (ROBAXIN) 500 MG  tablet Take 1 tablet (500 mg total) by mouth 2 (two) times daily. Patient not taking: Reported on 03/26/2014 08/07/13   Lise AuerJennifer L Piepenbrink, PA-C  penicillin v potassium (VEETID) 500 MG tablet Take 1 tablet (500 mg total) by mouth 4 (four) times daily. 03/26/14 04/02/14  Garlon HatchetLisa M Sanders, PA-C   BP 140/87 mmHg  Pulse 53  Temp(Src) 98.3 F (36.8 C) (Oral)  Resp 20  Ht 6' (1.829 m)  Wt 190 lb (86.183 kg)  BMI 25.76 kg/m2  SpO2 100% Physical Exam  Constitutional: He is oriented to person, place, and time. He appears well-developed and well-nourished. No distress.  HENT:  Head: Normocephalic and atraumatic.  Mouth/Throat: Uvula is midline, oropharynx is clear and moist and mucous membranes are normal. Abnormal dentition. Dental abscesses and dental caries present. No oropharyngeal exudate, posterior oropharyngeal edema, posterior oropharyngeal erythema or tonsillar abscesses.  Teeth largely in poor dentition, right lower molar with large cavity, surrounding gingiva significantly swollen, handling secretions appropriately, no trismus; no facial or neck swelling  Eyes: Conjunctivae and EOM are normal. Pupils are equal, round, and reactive to light.  Neck: Normal range of motion. Neck supple.  Cardiovascular: Normal rate, regular rhythm and normal heart sounds.   Pulmonary/Chest: Effort normal and breath sounds normal. No respiratory distress. He has no wheezes.  Musculoskeletal: Normal range of motion.  Neurological: He is alert and oriented to person, place, and time.  Skin: Skin is warm and dry. He is not diaphoretic.  Psychiatric: He has a normal mood and affect.  Nursing note and vitals reviewed.   ED Course  Procedures (including critical care time) Labs Review Labs Reviewed - No data to display  Imaging Review No results found.   EKG Interpretation None      MDM   Final diagnoses:  Dental abscess  Pain, dental   Dental pain with likely developing dental abscess. Patient  was started on abx and pain meds, first dose given in the ED.  Recommended FU with dentist, referrals and resource guide provided.  Discussed plan with patient, he/she acknowledged understanding and agreed with plan of care.  Return precautions given for new or worsening symptoms.   Garlon HatchetLisa M Sanders, PA-C 03/26/14 1855  Gilda Creasehristopher J. Pollina, MD 03/26/14 [phone]

## 2014-03-26 NOTE — Discharge Instructions (Signed)
Take the prescribed medication as directed. °Follow-up with dentist. °Return to the ED for new or worsening symptoms. ° ° °Emergency Department Resource Guide °1) Find a Doctor and Pay Out of Pocket °Although you won't have to find out who is covered by your insurance plan, it is a good idea to ask around and get recommendations. You will then need to call the office and see if the doctor you have chosen will accept you as a new patient and what types of options they offer for patients who are self-pay. Some doctors offer discounts or will set up payment plans for their patients who do not have insurance, but you will need to ask so you aren't surprised when you get to your appointment. ° °2) Contact Your Local Health Department °Not all health departments have doctors that can see patients for sick visits, but many do, so it is worth a call to see if yours does. If you don't know where your local health department is, you can check in your phone book. The CDC also has a tool to help you locate your state's health department, and many state websites also have listings of all of their local health departments. ° °3) Find a Walk-in Clinic °If your illness is not likely to be very severe or complicated, you may want to try a walk in clinic. These are popping up all over the country in pharmacies, drugstores, and shopping centers. They're usually staffed by nurse practitioners or physician assistants that have been trained to treat common illnesses and complaints. They're usually fairly quick and inexpensive. However, if you have serious medical issues or chronic medical problems, these are probably not your best option. ° °No Primary Care Doctor: °- Call Health Connect at  832-8000 - they can help you locate a primary care doctor that  accepts your insurance, provides certain services, etc. °- Physician Referral Service- 1-800-533-3463 ° °Chronic Pain Problems: °Organization         Address  Phone   Notes  °[facility]  ([phone]) [phone] Patients need to be referred by their primary care doctor.  ° °Medication Assistance: °Organization         Address  Phone   Notes  °[place] [facility] [address] °[place], [place] [address] (336) 641-8030 --Must be a resident of [place] °-- Must have NO insurance coverage whatsoever (no Medicaid/ Medicare, etc.) °-- The pt. MUST have a primary care doctor that directs their care regularly and follows them in the community °  °[facility]  (866) 331-1348   °United Way  (888) 892-1162   ° °Agencies that provide inexpensive medical care: °Organization         Address  Phone   Notes  °[facility]  (336) 832-8035   °[facility]    (336) 832-7272   °[facility] [address] °[place], [place] [address] (336) 832-4777   °[facility] [address], °[place] (336) 271-4999   °[facility]    (336) 373-0678   °[facility]    (336) 272-1050   °[facility] ° [address], [place] Phone:  (336) 832-4444, Fax:  (336) 832-4440 Hours of Operation:  9 am - 6 pm, M-F.  Also accepts Medicaid/Medicare and self-pay.  °[facility] ° [address], [place] Phone: (336) 832-3150, Fax: (336) [phone]. Hours   of Operation:  8:30 am - 5:30 pm, M-F.  Also accepts Medicaid and self-pay.  [facility] [place] [address], [place] Phone: [phone]   [place], [place], [place] (929)607-0001, Ext. [phone] Mondays & Thursdays: 7-9 AM.  First 15 patients are seen on a first come, first serve basis.    Ewa Gentry Providers:  Organization         Address  Phone   Notes  [facility] [address], [place] [phone] Also accepts self-pay patients.  [facility] [address], [place]  ([phone]   [place], [address], [place] [phone]   [facility] [address], [place] [phone]   Lucianne Lei [address], [place]   ([phone] Only accepts [place] Access [place] patients after they have their name applied to their card.   Self-Pay (no insurance) in [facility]:  Organization         Address  Phone   Notes  Sickle Cell Patients, [facility] [place] [phone]   [facility] [place] [phone]   [facility] [place]  [place], [place], [place] [phone]   [facility]/Dr. Osei-Bonsu  [address], [place] or [address], [place] [phone] Phone number for both [place] and [place] locations is the same.  [facility] [address] [place] [phone]   [facility] [address] [place] or [address] [phone]   [facility] [address], [place] [phone], phone; [phone], fax Sees patients 1st and 3rd Saturday of every month.  Must not qualify for public or private insurance (i.e. Medicaid, Medicare, [place] Health Choice, Veterans' Benefits)  Household income should be no more than 200% of the poverty level The clinic cannot treat you if you are pregnant or think you are pregnant  Sexually transmitted diseases are not treated at the clinic.    Dental Care: Organization         Address  Phone  Notes  [facility] [phone] Accepts children up to age 25 who are enrolled in [place] or [place]; pregnant women with a Medicaid card; and children who have applied for Medicaid  or [place] Health Choice, but were declined, whose parents can pay a reduced fee at time of service.  [facility]  [address], [place] [phone] Accepts children up to age 71 who are enrolled in [place] or [place]; pregnant women with a Medicaid card; and children who have applied for Medicaid or [place] Health Choice, but were declined, whose parents can pay a reduced fee at time of service.  [place]  [place] [phone] Patients are seen by appointment only. Walk-ins are not accepted. [place] will see patients 52 years of age and older. Monday - Tuesday (8am-5pm) Most Wednesdays (8:30-5pm) $30 per visit, cash only  [facility]  [address], [place] [phone] Patients are  seen by appointment only. Walk-ins are not accepted. [place] will see patients 30 years of age and older. One Wednesday Evening (Monthly: Volunteer Based).  $30 per visit, cash only  [place]  [phone] for adults; Children under age 61, call Graduate Pediatric Dentistry at [phone]. Children aged 81-14, please call [phone] to request a pediatric application.  Dental services are provided in all areas of dental care including fillings, crowns and bridges, complete and partial dentures, implants, gum treatment, root canals, and extractions. Preventive care is also provided. Treatment is provided to both adults and children. Patients are selected via a lottery and there is often a waiting list.   [facility] [address] [place]  ([phone] [email or web address]   [facility] [address] [place], [place] [phone], Ext. [phone] Second and Fourth Thursday of each month, opens at 6:30 AM; Clinic ends at 9 AM.  Patients are seen on a first-come first-served basis, and a limited number are seen  during each clinic.   [facility]  [address] Hillard Danker [place], [place] [phone]   Eligibility Requirements You must have lived in [place], [place], or [place] counties for at least the last three months.   You cannot be eligible for state or federal sponsored [employer], including [employer], [place], or [employer].   You generally cannot be eligible for healthcare insurance through your employer.    How to apply: Eligibility screenings are held every Tuesday and Wednesday afternoon from 1:00 pm until 4:00 pm. You do not need an appointment for the interview!  [facility] [address] [place], [place]   [place]  [place] Department  [place]  [phone]    Behavioral Health Resources in the Community: Intensive Outpatient Programs Organization         Address  Phone  Notes  [place]. [address], [place], [place] [phone]   [facility] [address], [place], [place]   [facility] [address] [place], [place]   [place] [address]  [place], [place] or [phone]   Substance Abuse Resources Organization         Address  Phone  Notes  [facility]  586-249-4233   [place]  [phone]   [facility] [place]   Chinita Pester  (660)678-6058   Residential & Outpatient Substance Abuse Program  (365)835-0330   Psychological Services Organization         Address  Phone  Notes  [facility]  [place]  [phone]   [place] [address] [place] or [phone]    Mobile Crisis Teams Organization         Address  Phone  Notes  [facility],  Mobile Crisis Care Unit  (740) [phone]   [facility]  [address] [place], [place]   [place] [address], [place] [phone]    Self-Help/Support Groups Organization         Address  Phone             Notes  [place]. of  - variety of support groups  [place] Call for  more information  °Narcotics Anonymous (NA), [facility] [address], °[place]  2 meetings at this location  ° °Residential Treatment Programs °Organization         Address  Phone  Notes  °[facility] [address],    °[place]  1-866-801-8205   °[facility] ° [address], [place], [place] [phone]   °[facility] [address], [place] [phone] Admissions: 8am-3pm M-F  °[facility] [address]    °[place], [place] [phone]   °[facility] [address], [place], [place] [phone]   °[facility] [address]  °[place], [place] [phone]   °[facility] - Intensive Outpatient [address], [place], [place] [phone]   °[facility] [address]  °[place], [place] 1-877-615-2722 or 336-784-9470   °[facility] ([facility]) [address] [place], [place] [phone] Accepts Medicaid  °[facility] [address]  °[place] 1-800-659-3381 Substance Abuse/Addiction Treatment  ° °[place] Behavioral Health Resources °Organization         Address  Phone  Notes  °[facility]  (888) 581-9988   °Julie Brannon, PhD [address] [place], [place]   (336) 349-5553 or (336) 951-0000   °[facility]   [address] °[place], [place] (336) 349-4454   °[facility] [address], [place], [place] (336) [phone] Insurance/Medicaid/sponsorship through [facility]  °[facility] [address]                                    [place], [place] (336) [phone] Therapy/tele-psych/case  °[facility] [address]  ° [place], [place] (336) 349-2233    °Dr. Arfeen  (336) [phone]   °[facility] [place]  [employer] [place] [facility] 1) [address], [place] °2) [address], [place] °3)  [address], [place] (336) 349-3220 °(336) 342-7768 ° °(336) 342-8140   °[place] Child Abuse Hotline (336) 342-1394 or (336) 342-3537 (After Hours)    ° ° ° °

## 2014-03-26 NOTE — ED Notes (Signed)
Pt having left lower gum and dental pain. sts started today.

## 2014-03-26 NOTE — ED Provider Notes (Signed)
  I DID NOT SEE THIS PATIENT AT THIS VISIT. UNABLE TO DELETE CHART.    Chief Complaint  Patient presents with  . Dental Pain   The history is provided by the patient. No language interpreter was used.   HPI Comments:   Past Medical History  Diagnosis Date  . Sciatica   . Chronic back pain    History reviewed. No pertinent past surgical history. History reviewed. No pertinent family history. History  Substance Use Topics  . Smoking status: Never Smoker   . Smokeless tobacco: Not on file  . Alcohol Use: No    Review of Systems  All other systems reviewed and are negative.     Allergies  Review of patient's allergies indicates no known allergies.  Home Medications   Prior to Admission medications   Medication Sig Start Date End Date Taking? Authorizing Provider  ibuprofen (ADVIL,MOTRIN) 800 MG tablet Take 1 tablet (800 mg total) by mouth 3 (three) times daily. 08/07/13  Yes Jennifer L Piepenbrink, PA-C  lidocaine (LIDODERM) 5 % Place 1 patch onto the skin daily. Remove & Discard patch within 12 hours or as directed by MD Patient not taking: Reported on 03/26/2014 08/07/13   Victorino DikeJennifer L Piepenbrink, PA-C  methocarbamol (ROBAXIN) 500 MG tablet Take 1 tablet (500 mg total) by mouth 2 (two) times daily. Patient not taking: Reported on 03/26/2014 08/07/13   Victorino DikeJennifer L Piepenbrink, PA-C   BP 123/68 mmHg  Pulse 56  Temp(Src) 98.1 F (36.7 C) (Oral)  Resp 18  Ht 6' (1.829 m)  Wt 190 lb (86.183 kg)  BMI 25.76 kg/m2  SpO2 98% Physical Exam  ED Course  Procedures (including critical care time)  DIAGNOSTIC STUDIES: Oxygen Saturation is 98% on RA, normal by my interpretation.    COORDINATION OF CARE:    Labs Review Labs Reviewed - No data to display  Imaging Review No results found.   EKG Interpretation None      MDM   Final diagnoses:  None      Dorthula Matasiffany G H[place]ston Hitchens, PA-C 03/28/14 1206  Glynn OctaveStephen Rancour, MD 03/28/14 [phone]

## 2014-03-28 ENCOUNTER — Encounter (HOSPITAL_COMMUNITY): Payer: Self-pay | Admitting: *Deleted

## 2014-03-28 ENCOUNTER — Emergency Department (HOSPITAL_COMMUNITY)
Admission: EM | Admit: 2014-03-28 | Discharge: 2014-03-28 | Disposition: A | Discharge status: Home / Self Care | Payer: Self-pay | Attending: Emergency Medicine | Admitting: Emergency Medicine

## 2014-03-28 ENCOUNTER — Emergency Department (HOSPITAL_COMMUNITY): Payer: Self-pay

## 2014-03-28 DIAGNOSIS — Y9289 Other specified places as the place of occurrence of the external cause: Secondary | ICD-10-CM | POA: Insufficient documentation

## 2014-03-28 DIAGNOSIS — M5430 Sciatica, unspecified side: Secondary | ICD-10-CM | POA: Insufficient documentation

## 2014-03-28 DIAGNOSIS — W228XXA Striking against or struck by other objects, initial encounter: Secondary | ICD-10-CM | POA: Insufficient documentation

## 2014-03-28 DIAGNOSIS — Z79899 Other long term (current) drug therapy: Secondary | ICD-10-CM | POA: Insufficient documentation

## 2014-03-28 DIAGNOSIS — S60221A Contusion of right hand, initial encounter: Secondary | ICD-10-CM | POA: Insufficient documentation

## 2014-03-28 DIAGNOSIS — Y9389 Activity, other specified: Secondary | ICD-10-CM | POA: Insufficient documentation

## 2014-03-28 DIAGNOSIS — T1490XA Injury, unspecified, initial encounter: Secondary | ICD-10-CM

## 2014-03-28 DIAGNOSIS — Y998 Other external cause status: Secondary | ICD-10-CM | POA: Insufficient documentation

## 2014-03-28 DIAGNOSIS — G8929 Other chronic pain: Secondary | ICD-10-CM | POA: Insufficient documentation

## 2014-03-28 DIAGNOSIS — Z792 Long term (current) use of antibiotics: Secondary | ICD-10-CM | POA: Insufficient documentation

## 2014-03-28 DIAGNOSIS — S6991XA Unspecified injury of right wrist, hand and finger(s), initial encounter: Secondary | ICD-10-CM

## 2014-03-28 DIAGNOSIS — Z23 Encounter for immunization: Secondary | ICD-10-CM | POA: Insufficient documentation

## 2014-03-28 MED ORDER — OXYCODONE-ACETAMINOPHEN 5-325 MG PO TABS
1.0000 | ORAL_TABLET | Freq: Once | ORAL | Status: AC
  Administered 2014-03-28: 1 via ORAL
  Filled 2014-03-28: qty 1

## 2014-03-28 MED ORDER — TRAMADOL HCL 50 MG PO TABS: 50.0000 mg | ORAL_TABLET | Freq: Four times a day (QID) | ORAL | Status: AC | PRN

## 2014-03-28 MED ORDER — ONDANSETRON 4 MG PO TBDP
4.0000 mg | ORAL_TABLET | Freq: Once | ORAL | Status: AC
  Administered 2014-03-28: 4 mg via ORAL
  Filled 2014-03-28: qty 1

## 2014-03-28 MED ORDER — TETANUS-DIPHTH-ACELL PERTUSSIS 5-2.5-18.5 LF-MCG/0.5 IM SUSP
0.5000 mL | Freq: Once | INTRAMUSCULAR | Status: AC
  Administered 2014-03-28: 0.5 mL via INTRAMUSCULAR
  Filled 2014-03-28: qty 0.5

## 2014-03-28 NOTE — ED Notes (Signed)
Pt off unit with xray 

## 2014-03-28 NOTE — Discharge Instructions (Signed)
INFORMATION on Acute Compartment Syndrome Compartment syndrome is a painful condition that occurs when swelling and pressure build up in a body space (compartment) of the arms or legs. Groups of muscles, nerves, and blood vessels in the arms and legs are separated into various compartments. Each compartment is surrounded by tough layers of tissue called fascia. In compartment syndrome, pressure builds up within the layers of fascia and begins to push on the structures within that compartment.  In acute compartment syndrome, the pressure builds up suddenly, often as the result of an injury. This is a surgical emergency. When a muscle in the compartment moves, you may feel severe pain. If pressure continues to increase, it can block the flow of blood in the smallest blood vessels (capillaries). Then, the nerves and muscles in the compartment cannot get enough oxygen and nutrients (substances needed for survival). They will start to die within 4-8 hours. That is why the pressure needs to be relieved immediately. Identifying the condition early and treating it quickly can prevent most problems. CAUSES  Various things can lead to compartment syndrome. Possible causes include:   Injury. Some injuries can cause swelling or bleeding in a compartment. This can lead to compartment syndrome. Injuries that may cause this problem include:  Broken bones, especially the long bones of the arms and legs.  Crushing injuries.  Penetrating injuries, such as a knife wound that punctures the skin and tissue underneath.  Badly bruised muscles.  Poisonous bites, such as a snake bite.  Severe burns.  Blocked blood flow. This could result from:  A cast or bandage that is too tight.  A surgical procedure. Blood flow sometimes has to be stopped for a while during a surgery, usually with a tourniquet.  Lying for too long in a position that restricts blood flow. This can happen in people who have nerve damage or if a  person is unconscious for a long time.  Drugs used to build up muscles (anabolic steroids).  Drugs that keep the blood from forming clots (blood thinners). SIGNS AND SYMPTOMS  The most common symptom of compartment syndrome is pain. The pain may:   Get worse when moving or stretching the affected body part.  Be more severe than it should be for an injury.  Come along with a feeling of tingling or burning.  Become worse when the area is pushed or squeezed.  Be unaffected by pain medicine. Other symptoms include:   A feeling of tightness or fullness in the affected area.   A loss of feeling.  Weakness in the area.  Loss of movement.  Skin becoming pale, tight, and shiny over the painful area.  DIAGNOSIS  Your health care provider may suspect the problem based on how you describe the pain. The diagnosis is made by using a special device that measures the pressure in the affected area. Blood tests, X-rays, or an ultrasound exam may be done to help rule out other problems.  TREATMENT  Compartment syndrome is a surgical emergency. It should be treated very quickly.   First-aid treatment is given first. This may include:  Promptly treating an injury.  Loosening or removing any cast, bandage, or external wrap that may be causing pain.  Raising the painful arm or leg to the same level as the heart.  Giving oxygen.  Giving fluid through an IV access tube that is put into a vein in the hand or arm.  Surgery (fasciotomy) is needed to relieve the pressure and help  prevent permanent damage. In this surgery, cuts (incisions) are made through the fascia to relieve the pressure in the compartment. Document Released: 03/04/2009 Document Revised: 11/16/2012 Document Reviewed: 10/18/2012 [facility] Patient Information 2015 [place], [place]. This information is not intended to replace advice given to you by your health care provider. Make sure you discuss any questions you have with your  health care provider. Elastic Bandage and RICE Elastic bandages come in different shapes and sizes. They perform different functions. Your caregiver will help you to decide what is best for your protection, recovery, or rehabilitation following an injury. The following are some general tips to help you use an elastic bandage.  Use the bandage as directed by the maker of the bandage you are using.  Do not wrap it too tight. This may cut off the circulation of the arm or leg below the bandage.  If part of your body beyond the bandage becomes blue, numb, or swollen, it is too tight. Loosen the bandage as needed to prevent these problems.  See your caregiver or trainer if the bandage seems to be making your problems worse rather than better. Bandages may be a reminder to you that you have an injury. However, they provide very little support. The few pounds of support they provide are minor considering the pressure it takes to injure a joint or tear ligaments. Therefore, the joint will not be able to handle all of the wear and tear it could before the injury. The routine care of many injuries includes Rest, Ice, Compression, and Elevation (RICE).  Rest is required to allow your body to heal. Generally, routine activities can be resumed when comfortable. Injured tendons and bones take about 6 weeks to heal.  Icing the injury helps keep the swelling down and reduces pain. Do not apply ice directly to the skin. Put ice in a plastic bag. Place a towel between the skin and the bag. This will prevent frostbite to the skin. Apply ice bags to the injured area for 15-20 minutes, every 2 hours while awake. Do this for the first 24 to 48 hours, then as directed by your caregiver.  Compression helps keep swelling down, gives support, and helps with discomfort. If an elastic bandage has been applied today, it should be removed and reapplied every 3 to 4 hours. It should not be applied tightly, but firmly enough to  keep swelling down. Watch fingers or toes for swelling, bluish discoloration, coldness, numbness, or increased pain. If any of these problems occur, remove the bandage and reapply it more loosely. If these problems persist, contact your caregiver.  Elevation helps reduce swelling and decreases pain. The injured area (arms, hands, legs, or feet) should be placed near to or above the heart (center of the chest) if able. Persistent pain and inability to use the injured area for more than 2 to 3 days are warning signs. You should see a caregiver for a follow-up visit as soon as possible. Initially, a minor broken bone (hairline fracture) may not be seen on X-rays. It may take 7 to 10 days to finally show up. Continued pain and swelling show that further evaluation and/or X-rays are needed. Make a follow-up visit with your caregiver. A specialist in reading X-rays (radiologist) will read your X-rays again. Finding out the results of your test Not all test results are available during your visit. If your test results are not back during the visit, make an appointment with your caregiver to find out the results.  Do not assume everything is normal if you have not heard from your caregiver or the medical facility. It is important for you to follow up on all of your test results. Document Released: 09/05/2001 Document Revised: 06/08/2011 Document Reviewed: 07/18/2007 [facility] Patient Information 2015 [place]. This information is not intended to replace advice given to you by your health care provider. Make sure you discuss any questions you have with your health care provider.

## 2014-03-28 NOTE — ED Notes (Signed)
Pt returned to room from xray.

## 2014-03-28 NOTE — ED Notes (Signed)
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

## 2014-03-28 NOTE — ED Provider Notes (Signed)
CSN: [id number]     Arrival date & time 03/28/14  1158 History   First MD Initiated Contact with Patient 03/28/14 1200     Chief Complaint  Patient presents with  . Finger Injury     (Consider location/radiation/quality/duration/timing/severity/associated sxs/prior Treatment) HPI   Pt presents to the ED by EMS after a hand injury while working. 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 He is not having any abdominal pain, he had multiple layers of clothes on, his jumper and a sweatshirt/rain jacket as it is raining heavily outside. The cable wire hit his posterior hand. He complains of pain, no laceration, small amount of abrasions and bleeding.He is not UTD on tetanus.   Past Medical History  Diagnosis Date  . Sciatica   . Chronic back pain    History reviewed. No pertinent past surgical history. History reviewed. No pertinent family history. History  Substance Use Topics  . Smoking status: Never Smoker   . Smokeless tobacco: Not on file  . Alcohol Use: No    Review of Systems 10 Systems reviewed and are negative for acute change except as noted in the HPI.    Allergies  Review of patient's allergies indicates no known allergies.  Home Medications   Prior to Admission medications   Medication Sig Start Date End Date Taking? Authorizing Provider  HYDROcodone-acetaminophen (NORCO/VICODIN) 5-325 MG per tablet Take 1 tablet by mouth every 4 (four) hours as needed. 03/26/14  Yes Garlon HatchetLisa M Sanders, PA-C  ibuprofen (ADVIL,MOTRIN) 800 MG tablet Take 1 tablet (800 mg total) by mouth 3 (three) times daily. 08/07/13  Yes Jennifer L Piepenbrink, PA-C  penicillin v potassium (VEETID) 500 MG tablet Take 1 tablet (500 mg total) by mouth 4 (four) times daily. 03/26/14 04/02/14 Yes Garlon HatchetLisa M Sanders, PA-C  lidocaine (LIDODERM) 5 % Place 1 patch onto the skin daily.  Remove & Discard patch within 12 hours or as directed by MD Patient not taking: Reported on 03/26/2014 08/07/13   Victorino DikeJennifer L Piepenbrink, PA-C  methocarbamol (ROBAXIN) 500 MG tablet Take 1 tablet (500 mg total) by mouth 2 (two) times daily. Patient not taking: Reported on 03/26/2014 08/07/13   Lise AuerJennifer L Piepenbrink, PA-C  traMADol (ULTRAM) 50 MG tablet Take 1 tablet (50 mg total) by mouth every 6 (six) hours as needed. 03/28/14   Tyland Klemens Irine SealG Kiree Dejarnette, PA-C   BP 113/80 mmHg  Pulse 99  Temp(Src) 98.6 F (37 C) (Oral)  Resp 18  SpO2 98% Physical Exam  Constitutional: He appears well-developed and well-nourished. No distress.  HENT:  Head: Normocephalic and atraumatic.  Eyes: Pupils are equal, round, and reactive to light.  Neck: Normal range of motion. Neck supple.  Cardiovascular: Normal rate and regular rhythm.   Pulmonary/Chest: Effort normal.  Abdominal: Soft.  No abrasions, erythema, ecchymosis, distention or tenderness to the abdominal wall.  Musculoskeletal:       Hands: Pt has swelling and large contusion to posterior hand. A few scattered small associated abrasions. No lacerations, hand is clean and dry. He is able to feel and extend/flex all 5 finger, with some discomfort, no deformities.  The muscles are smooth and non firm to the thenar eminences.  Neurological: He is alert.  Skin: Skin is warm and dry.  Nursing note and vitals reviewed.   ED Course  Procedures (including critical care time)  Labs Review Labs Reviewed - No data to display  Imaging Review Dg Hand Complete Right  03/28/2014   CLINICAL DATA:  Acute right hand pain and injury after trauma.  EXAM: RIGHT HAND - COMPLETE 3+ VIEW  COMPARISON:  None.  FINDINGS: There is no evidence of fracture or dislocation. There is no evidence of arthropathy or other focal bone abnormality. Soft tissues are unremarkable.  IMPRESSION: Normal right hand.   Electronically Signed   By: Roque LiasJames  Green M.D.   On: 03/28/2014 13:25      EKG Interpretation None      MDM   Final diagnoses:  Injury  Hand injury, right, initial encounter    I have discussed the patient with my supervising attending who is aware of my work-up and plan.  The patient has no bony injuries. His skin is soft and non firm, he is at risk for compartment syndrome or infection and has been given information on what would warrant return to ED.  Medications  Tdap (BOOSTRIX) injection 0.5 mL (0.5 mLs Intramuscular Given 03/28/14 1232)  oxyCODONE-acetaminophen (PERCOCET/ROXICET) 5-325 MG per tablet 1 tablet (1 tablet Oral Given 03/28/14 1232)  ondansetron (ZOFRAN-ODT) disintegrating tablet 4 mg (4 mg Oral Given 03/28/14 1232)    He is currently on PCN for dental pain, starting abx today. Therefore will not Rx a new rx at this visit. Wounds cleaned and irrigated, wrapped up in ACE wrap. Work note to rest hand for 1 week given.  28 y.o.Luanna SalkDesmond Legacy's evaluation in the Emergency Department is complete. It has been determined that no acute conditions requiring further emergency intervention are present at this time. The patient/guardian have been advised of the diagnosis and plan. We have discussed signs and symptoms that warrant return to the ED, such as changes or worsening in symptoms.  Vital signs are stable at discharge. Filed Vitals:   03/28/14 1354  BP: 113/80  Pulse: 99  Temp:   Resp: 18    Patient/guardian has voiced understanding and agreed to follow-up with the PCP or specialist.     Dorthula Matasiffany G Cniyah Sproull, PA-C 03/28/14 1403  Glynn OctaveStephen Rancour, MD 03/28/14 [id number]

## 2014-06-05 ENCOUNTER — Emergency Department (HOSPITAL_COMMUNITY): Payer: Self-pay

## 2014-06-05 ENCOUNTER — Emergency Department (HOSPITAL_COMMUNITY)
Admission: EM | Admit: 2014-06-05 | Discharge: 2014-06-06 | Disposition: A | Discharge status: Home / Self Care | Payer: Self-pay | Attending: Emergency Medicine | Admitting: Emergency Medicine

## 2014-06-05 ENCOUNTER — Encounter (HOSPITAL_COMMUNITY): Payer: Self-pay | Admitting: Emergency Medicine

## 2014-06-05 DIAGNOSIS — G8929 Other chronic pain: Secondary | ICD-10-CM | POA: Insufficient documentation

## 2014-06-05 DIAGNOSIS — W3400XA Accidental discharge from unspecified firearms or gun, initial encounter: Secondary | ICD-10-CM | POA: Insufficient documentation

## 2014-06-05 DIAGNOSIS — Y9389 Activity, other specified: Secondary | ICD-10-CM | POA: Insufficient documentation

## 2014-06-05 DIAGNOSIS — Y998 Other external cause status: Secondary | ICD-10-CM | POA: Insufficient documentation

## 2014-06-05 DIAGNOSIS — M5430 Sciatica, unspecified side: Secondary | ICD-10-CM | POA: Insufficient documentation

## 2014-06-05 DIAGNOSIS — S91331A Puncture wound without foreign body, right foot, initial encounter: Secondary | ICD-10-CM

## 2014-06-05 DIAGNOSIS — Y9289 Other specified places as the place of occurrence of the external cause: Secondary | ICD-10-CM | POA: Insufficient documentation

## 2014-06-05 DIAGNOSIS — S91301A Unspecified open wound, right foot, initial encounter: Secondary | ICD-10-CM | POA: Insufficient documentation

## 2014-06-05 DIAGNOSIS — S92251B Displaced fracture of navicular [scaphoid] of right foot, initial encounter for open fracture: Secondary | ICD-10-CM | POA: Insufficient documentation

## 2014-06-05 DIAGNOSIS — Z79899 Other long term (current) drug therapy: Secondary | ICD-10-CM | POA: Insufficient documentation

## 2014-06-05 DIAGNOSIS — Z791 Long term (current) use of non-steroidal anti-inflammatories (NSAID): Secondary | ICD-10-CM | POA: Insufficient documentation

## 2014-06-05 MED ORDER — CEFAZOLIN SODIUM 1-5 GM-% IV SOLN
1.0000 g | Freq: Once | INTRAVENOUS | Status: AC
Start: 2014-06-05 — End: 2014-06-05
  Administered 2014-06-05: 1 g via INTRAVENOUS
  Filled 2014-06-05: qty 50

## 2014-06-05 NOTE — ED Notes (Signed)
Pt transported to XRAY °
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
## 2014-06-06 MED ORDER — HYDROCODONE-ACETAMINOPHEN 5-325 MG PO TABS: 20.0000 | ORAL_TABLET | ORAL | Status: AC | PRN

## 2014-06-06 MED ORDER — CEPHALEXIN 500 MG PO CAPS: 1000.0000 mg | ORAL_CAPSULE | Freq: Two times a day (BID) | ORAL | Status: AC

## 2014-06-06 MED ORDER — IBUPROFEN 800 MG PO TABS: 800.0000 mg | ORAL_TABLET | Freq: Three times a day (TID) | ORAL | Status: AC

## 2014-06-06 NOTE — ED Notes (Signed)
Wound care given non adhesive dressing applied as ordered.

## 2014-06-06 NOTE — Progress Notes (Signed)
Orthopedic Tech Progress Note Patient Details:  Delynn FlavinDesmond Eddinger 04/12/1985 [id number]  Ortho Devices Type of Ortho Device: Short leg splint, Crutches Ortho Device/Splint Interventions: Application   Haskell Flirtewsome, Kylah Maresh M 06/06/2014, 12:37 AM
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
## 2014-06-06 NOTE — ED Notes (Signed)
Wrapped pt's right foot with 4x4s and large rolled gauze. Pt able to move toes and has sensation after dressing.
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
## 2014-06-08 ENCOUNTER — Telehealth: Payer: Self-pay | Admitting: *Deleted

## 2014-06-08 NOTE — Telephone Encounter (Signed)
Pharmacy called to verify prescription:      HYDROcodone-acetaminophen (NORCO/VICODIN) 5-325 MG per tablet   Sig: Take 20 tablets by mouth every 4 (four) hours as needed.     Start: 06/06/14    Quantity: 6 tablet Refills: 0         NCM advised 1 tablet every 4 hours, 20 tablets.

## 2015-07-02 ENCOUNTER — Encounter (HOSPITAL_COMMUNITY): Payer: Self-pay | Admitting: Emergency Medicine

## 2015-07-02 ENCOUNTER — Emergency Department (HOSPITAL_COMMUNITY)
Admission: EM | Admit: 2015-07-02 | Discharge: 2015-07-02 | Disposition: A | Discharge status: Home / Self Care | Payer: Self-pay | Attending: Emergency Medicine | Admitting: Emergency Medicine

## 2015-07-02 ENCOUNTER — Emergency Department (HOSPITAL_COMMUNITY): Payer: Self-pay

## 2015-07-02 DIAGNOSIS — K0889 Other specified disorders of teeth and supporting structures: Secondary | ICD-10-CM | POA: Insufficient documentation

## 2015-07-02 DIAGNOSIS — Z87828 Personal history of other (healed) physical injury and trauma: Secondary | ICD-10-CM | POA: Insufficient documentation

## 2015-07-02 DIAGNOSIS — M79672 Pain in left foot: Secondary | ICD-10-CM | POA: Insufficient documentation

## 2015-07-02 DIAGNOSIS — K029 Dental caries, unspecified: Secondary | ICD-10-CM | POA: Insufficient documentation

## 2015-07-02 DIAGNOSIS — G8929 Other chronic pain: Secondary | ICD-10-CM | POA: Insufficient documentation

## 2015-07-02 DIAGNOSIS — Z79899 Other long term (current) drug therapy: Secondary | ICD-10-CM | POA: Insufficient documentation

## 2015-07-02 HISTORY — DX: Accidental discharge from unspecified firearms or gun, initial encounter: W34.00XA

## 2015-07-02 MED ORDER — NAPROXEN 500 MG PO TABS: 500.0000 mg | ORAL_TABLET | Freq: Two times a day (BID) | ORAL | Status: AC

## 2015-07-02 MED ORDER — AMOXICILLIN-POT CLAVULANATE 875-125 MG PO TABS: 1.0000 | ORAL_TABLET | Freq: Two times a day (BID) | ORAL | Status: AC

## 2015-07-02 MED ORDER — NAPROXEN 250 MG PO TABS
500.0000 mg | ORAL_TABLET | Freq: Once | ORAL | Status: AC
  Administered 2015-07-02: 500 mg via ORAL
  Filled 2015-07-02: qty 2

## 2015-07-02 MED ORDER — AMOXICILLIN-POT CLAVULANATE 875-125 MG PO TABS
1.0000 | ORAL_TABLET | Freq: Once | ORAL | Status: AC
  Administered 2015-07-02: 1 via ORAL
  Filled 2015-07-02: qty 1
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
## 2015-07-02 NOTE — ED Notes (Signed)
Pt sts left foot pain and swelling; pt denies obvious injury 

## 2015-07-02 NOTE — Discharge Instructions (Signed)
Your foot x-ray today was normal. We gave you an ACE wrap for compression. You may take naproxen as needed for pain. Keep your foot elevated when you are resting at home. Please call Dr. Eliberto IvoryBlackman's office for orthopedic follow up if your symptoms persist.  Regarding your dental pain, please see your dentist as soon as possible. If you do not have a dentist you may contact one from the list below. In the meantime I will give you a prescription for antibiotics to prevent infection from getting worse.  [employer] [facility] The [employer] 211 is a great source of information about community services available.  Access by dialing 2-1-1 from anywhere in [place], or by website -  PooledIncome.plwww.nc211.org.   Other Local Resources (Updated 03/2015)  Dental  Care   Services    Phone Number and Address  Cost  [facility] For children 230 - 30 years of age:   Cleaning  Tooth brushing/flossing instruction  Sealants, fillings, crowns  Extractions  Emergency treatment  [phone] [address] [place], [place] [address] Charges based on family income.  Medicaid and some insurance plans accepted.     [facility]  Cleaning  Sealants, fillings, crowns  Extractions  Emergency treatment [phone] [address] [place], [place]  Pregnant women 30 years of age or older with a Medicaid card  [facility] [place]  Cleaning  Sealants, fillings, crowns  Extractions  Emergency treatment [phone] [address] [place], [place] Pregnant women 30 years of age or older with a Medicaid card  [facility] For children 310 - 30 years of age:   Cleaning  Tooth brushing/flossing instruction  Sealants, fillings, crowns  Extractions  Emergency treatment Limited orthodontic services for patients with Medicaid  [phone] [place], [place] [address] Medicaid and [facility] Health Choice cover for children up to age 30 and pregnant women.  Parents of children up to age 30 without Medicaid pay a reduced fee at time of service.  [facility] [place] For children 670 - 30 years of age:   Cleaning  Tooth brushing/flossing instruction  Sealants, fillings, crowns  Extractions  Emergency treatment Limited orthodontic services for patients with Medicaid [phone] [address] [place], [place].  Medicaid and [place] Health Choice cover for children up to age 30 and pregnant women.  Parents of children up to age 30 without Medicaid pay a reduced fee.  [facility]  Cleaning  Sealants, fillings, crowns  Extractions  Hours: Tuesdays and Thursdays, 4:15 - 8 pm [phone] [address], [address] [place], [place] [address] Services free of charge to [facility] residents ages 18-64 who do not have health insurance, Medicare, [place], or [place] benefits and fall within federal poverty guidelines  [facility]    Provides dental care in addition to primary medical care, nutritional counseling, and pharmacy:  Nurse, mental healthCleaning  Sealants, fillings, crowns  Extractions                  [phone] [facility], [address] [place], [place]  [phone] [facility], [address] [place], [place]  [phone] [facility] [place], [place]  [phone] [facility], [address] [place], [place]  [phone] [facility]  [address] [place], [place] Accepts Medicaid, [employer], most insurance.  Also provides services available to all with fees adjusted based on ability to pay.    [facility]  Cleaning  Tooth brushing/flossing  instruction  Sealants, fillings, crowns  Extractions  Emergency treatment Hours: Tuesdays, Thursdays, and Fridays from 8 am to 5 pm by appointment only. [phone] [address] [place], [place] [address] [facility] residents with Medicaid (depending on eligibility) and children with [facility] Health Choice - call for more information.  [facility]  Extractions only  Hours: 2nd and 4th Thursday of each month from 6:30 am - 9 am.   [phone]. [phone] [place], [place] [address] Ages 46 and older only.  Patients are seen on a first come, first served basis.  [employer]  [employer]  Extractions  Orthodontics  Endodontics  Implants/Crowns/Bridges  Complete and partial dentures [phone] [place], [place] Patients must complete an application for services.  There is often a waiting list.

## 2017-12-17 ENCOUNTER — Emergency Department (HOSPITAL_COMMUNITY): Payer: No Typology Code available for payment source

## 2017-12-17 ENCOUNTER — Emergency Department (HOSPITAL_COMMUNITY)
Admission: EM | Admit: 2017-12-17 | Discharge: 2017-12-17 | Disposition: A | Discharge status: Home / Self Care | Payer: No Typology Code available for payment source | Attending: Emergency Medicine | Admitting: Emergency Medicine

## 2017-12-17 ENCOUNTER — Encounter (HOSPITAL_COMMUNITY): Payer: Self-pay | Admitting: *Deleted

## 2017-12-17 ENCOUNTER — Other Ambulatory Visit: Payer: Self-pay

## 2017-12-17 DIAGNOSIS — S41112A Laceration without foreign body of left upper arm, initial encounter: Secondary | ICD-10-CM | POA: Diagnosis present

## 2017-12-17 DIAGNOSIS — Y939 Activity, unspecified: Secondary | ICD-10-CM | POA: Insufficient documentation

## 2017-12-17 DIAGNOSIS — Y999 Unspecified external cause status: Secondary | ICD-10-CM | POA: Insufficient documentation

## 2017-12-17 DIAGNOSIS — Y92410 Unspecified street and highway as the place of occurrence of the external cause: Secondary | ICD-10-CM | POA: Diagnosis not present

## 2017-12-17 NOTE — ED Provider Notes (Signed)
Went to evaluate the patient. Patient was not in the room   Sonora Catlin, MD 12/17/17 1139  
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
## 2017-12-17 NOTE — ED Notes (Signed)
Patient transported to X-ray 

## 2017-12-17 NOTE — ED Notes (Signed)
Wet to dry dressing applied on open wound. Non-adherent dressing, abd pad, and large gauze placed.   Pt aware of follow up with wound care.

## 2017-12-17 NOTE — Discharge Instructions (Signed)
Follow-up at the wound care clinic listed below for further evaluation. Return to ED for worsening symptoms, fever, redness or red streaks of your arm, draining pus from the site or additional injuries or falls.

## 2017-12-17 NOTE — ED Notes (Signed)
Patient verbalizes understanding of discharge instructions. Opportunity for questioning and answers were provided. Armband removed by staff, pt discharged from ED.  

## 2017-12-28 ENCOUNTER — Encounter (HOSPITAL_BASED_OUTPATIENT_CLINIC_OR_DEPARTMENT_OTHER): Payer: No Typology Code available for payment source | Attending: Internal Medicine

## 2017-12-28 DIAGNOSIS — S51812A Laceration without foreign body of left forearm, initial encounter: Secondary | ICD-10-CM | POA: Diagnosis not present

## 2017-12-28 DIAGNOSIS — S41112A Laceration without foreign body of left upper arm, initial encounter: Secondary | ICD-10-CM | POA: Insufficient documentation

## 2018-01-04 DIAGNOSIS — S41112A Laceration without foreign body of left upper arm, initial encounter: Secondary | ICD-10-CM | POA: Diagnosis not present

## 2018-01-12 DIAGNOSIS — S41112A Laceration without foreign body of left upper arm, initial encounter: Secondary | ICD-10-CM | POA: Diagnosis not present

## 2018-01-19 DIAGNOSIS — S41112A Laceration without foreign body of left upper arm, initial encounter: Secondary | ICD-10-CM | POA: Diagnosis not present

## 2018-01-31 ENCOUNTER — Encounter (HOSPITAL_BASED_OUTPATIENT_CLINIC_OR_DEPARTMENT_OTHER): Payer: No Typology Code available for payment source | Attending: Internal Medicine
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
